# Patient Record
Sex: Female | Born: 2000 | Race: Black or African American | Hispanic: Yes | Marital: Single | Smoking: Never smoker
Health system: Southern US, Community
[De-identification: ages and names within clinical notes are randomized; demographics above are authoritative.]

## PROBLEM LIST (undated history)

## (undated) DIAGNOSIS — F419 Anxiety disorder, unspecified: Secondary | ICD-10-CM

## (undated) DIAGNOSIS — F329 Major depressive disorder, single episode, unspecified: Secondary | ICD-10-CM

## (undated) DIAGNOSIS — F4310 Post-traumatic stress disorder, unspecified: Secondary | ICD-10-CM

## (undated) DIAGNOSIS — F909 Attention-deficit hyperactivity disorder, unspecified type: Secondary | ICD-10-CM

## (undated) DIAGNOSIS — F32A Depression, unspecified: Secondary | ICD-10-CM

## (undated) HISTORY — DX: Depression, unspecified: F32.A

## (undated) HISTORY — PX: HERNIA REPAIR: SHX51

## (undated) HISTORY — DX: Post-traumatic stress disorder, unspecified: F43.10

## (undated) HISTORY — DX: Attention-deficit hyperactivity disorder, unspecified type: F90.9

## (undated) HISTORY — DX: Anxiety disorder, unspecified: F41.9

---
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
## 2019-01-17 ENCOUNTER — Ambulatory Visit: Payer: Medicaid Other | Admitting: Adult Health Nurse Practitioner

## 2019-01-23 ENCOUNTER — Encounter: Payer: Self-pay | Admitting: Adult Health Nurse Practitioner

## 2021-01-26 IMAGING — CT CT HEAD W/O CM
3 series · 16 of 47 positions shown, 19 images · non-contrast
Comparison: None.

CLINICAL DATA: Altered mental status

EXAM:
CT HEAD WITHOUT CONTRAST
TECHNIQUE: Contiguous axial images were obtained from the base of the skull
through the vertex without intravenous contrast.

[Series 3: head 5.0 h30s · axial · 0.42mm/px · z∈[-106,+24]mm · 10 of 32 slices shown, 13 images]
[im 3/32  brain]
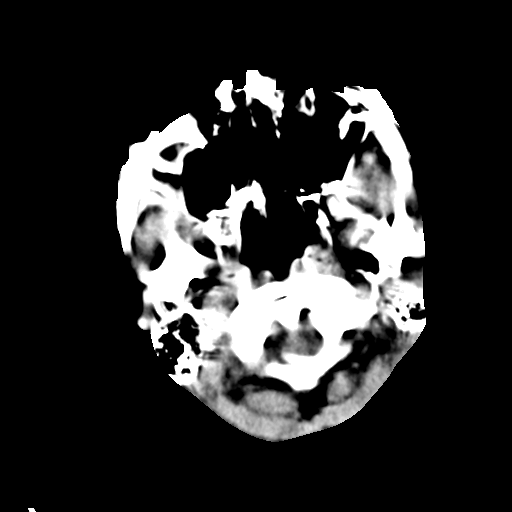
[im 3/32  bone]
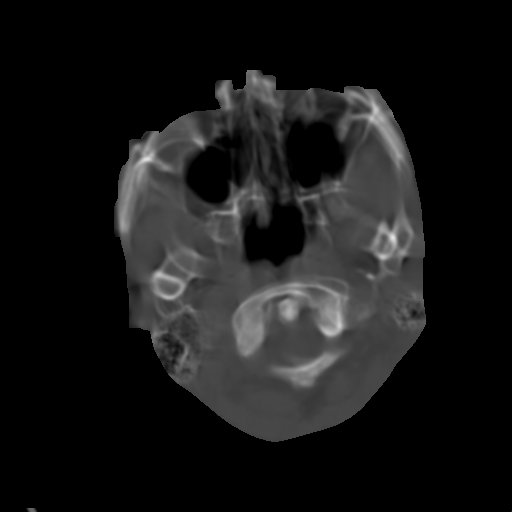
[im 6/32  brain]
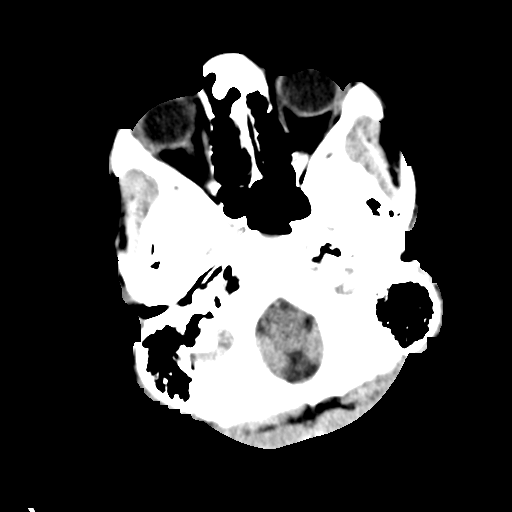
[im 9/32  brain]
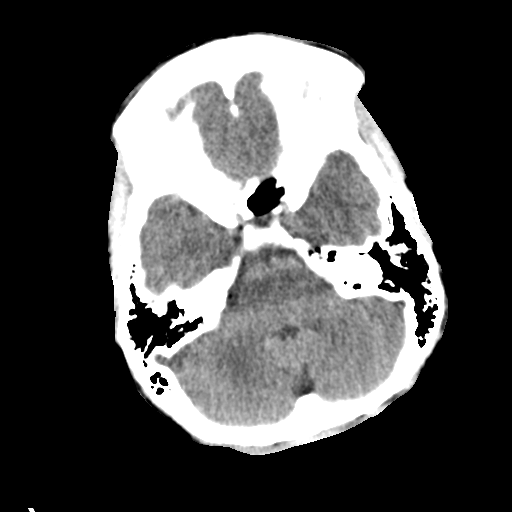
[im 11/32  brain]
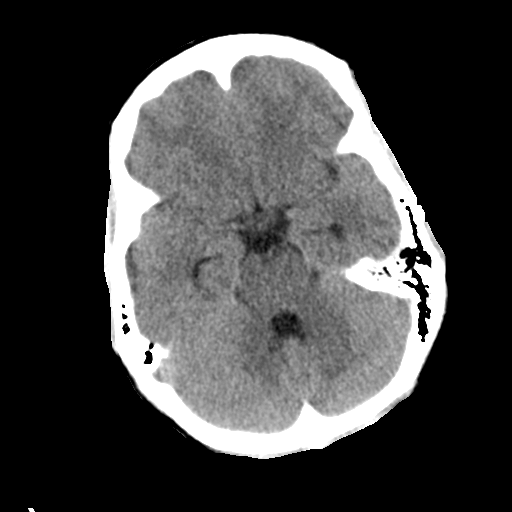
[im 14/32  brain]
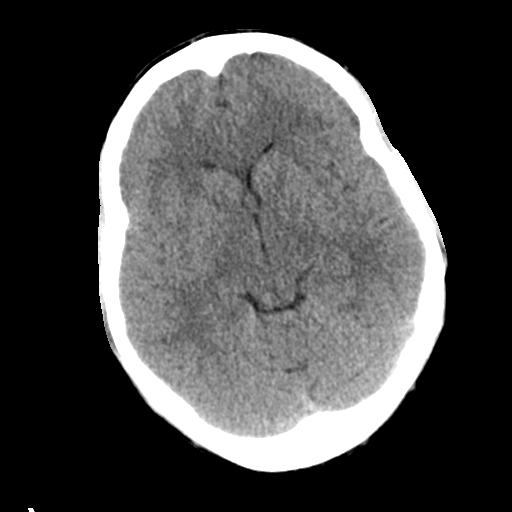
[im 14/32  bone]
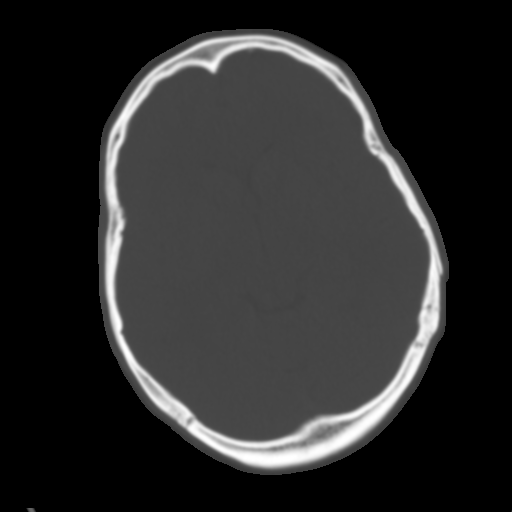
[im 18/32  brain]
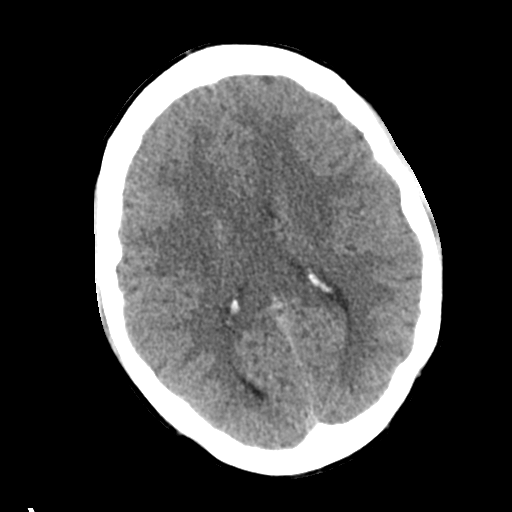
[im 21/32  brain]
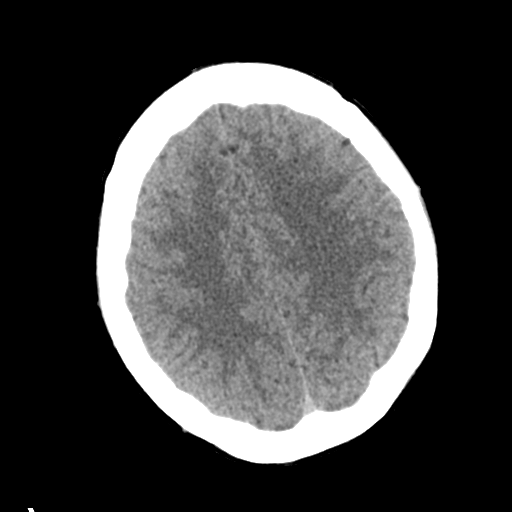
[im 24/32  brain]
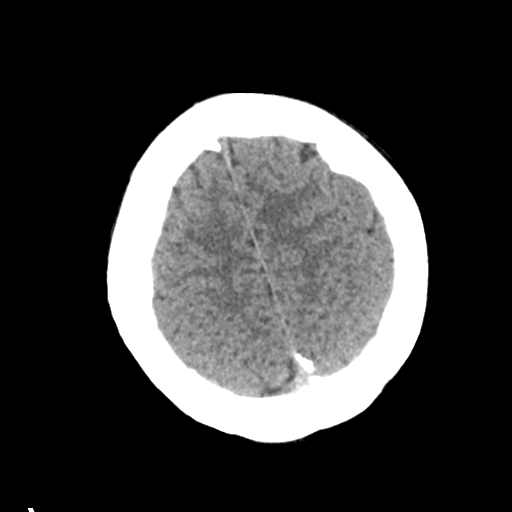
[im 26/32  brain]
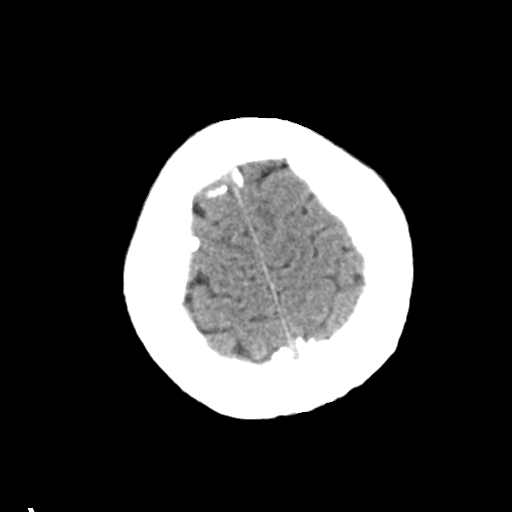
[im 26/32  bone]
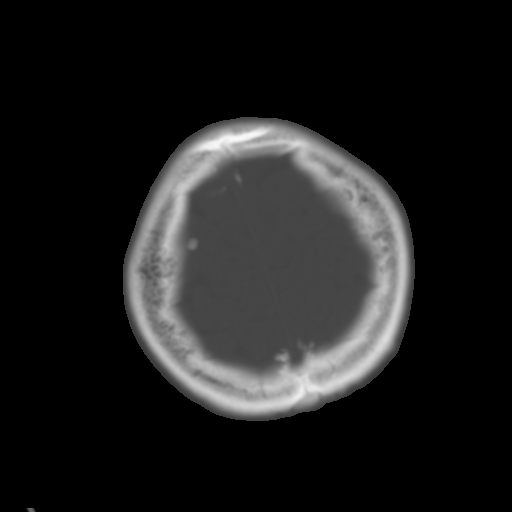
[im 29/32  brain]
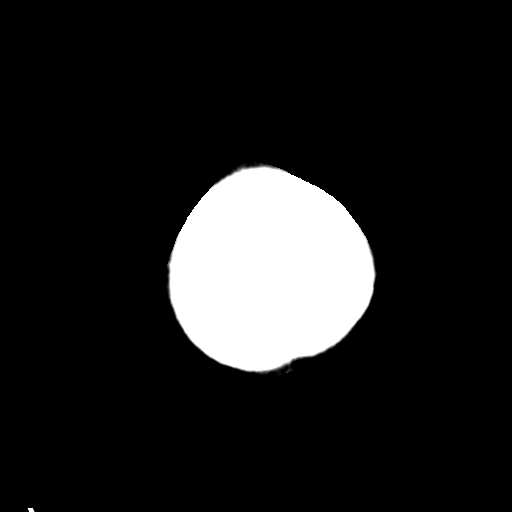

[Series 5: head 3.0 mpr cor · coronal · 0.33mm/px · 3 of 70 slices shown]
[im 24/70  brain]
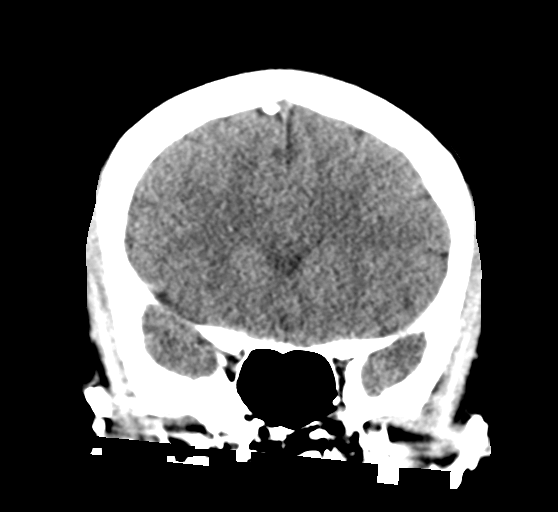
[im 31/70  brain]
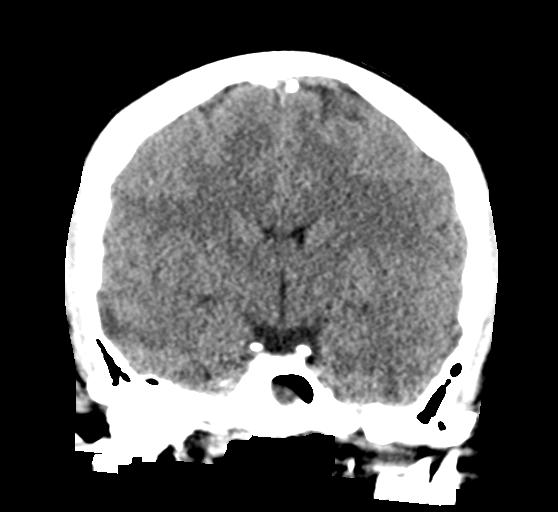
[im 39/70  brain]
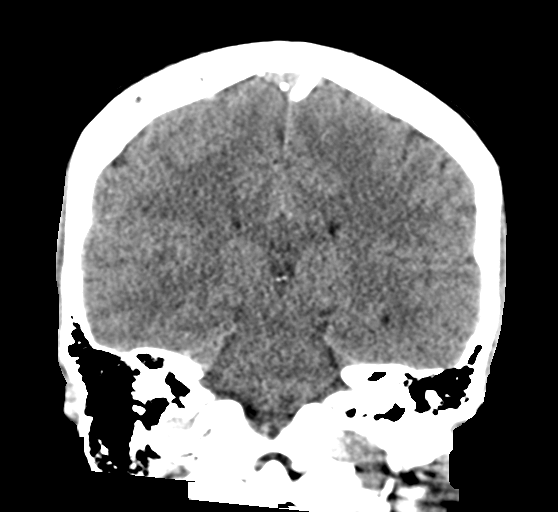

[Series 6: head 3.0 mpr sag · sagittal · 0.33mm/px · 3 of 59 slices shown]
[im 20/59  brain]
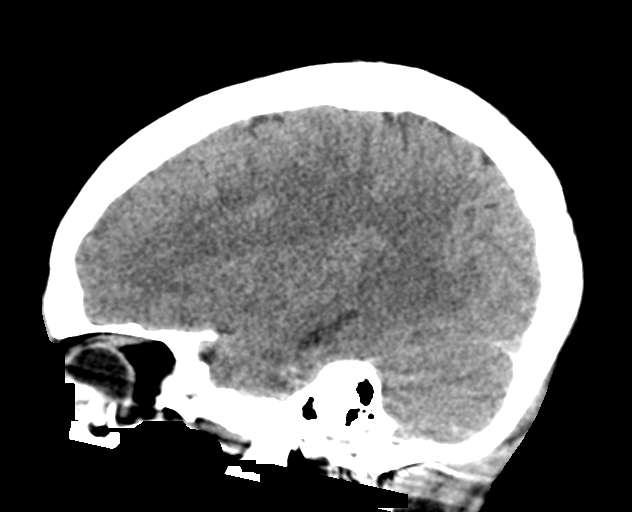
[im 30/59  brain]
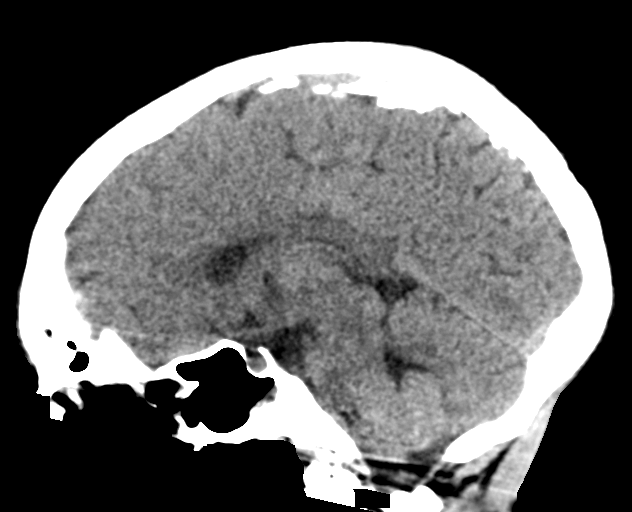
[im 39/59  brain]
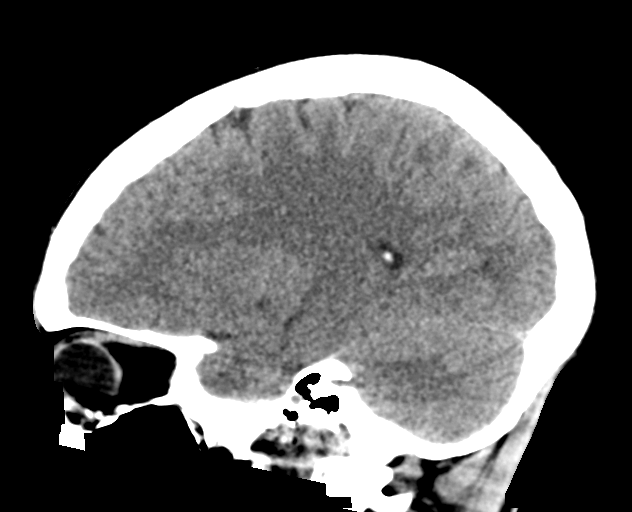

[16 of 47 positions shown; findings below may reference images not displayed]

FINDINGS: Brain: No evidence of acute infarction, hemorrhage, hydrocephalus,
extra-axial collection or mass lesion/mass effect.

Vascular: No hyperdense vessel or unexpected calcification.

Skull: Normal. Negative for fracture or focal lesion.

Sinuses/Orbits: No acute finding.

Other: None.
IMPRESSION: No acute intracranial abnormality noted.
